# Patient Record
Sex: Male | Born: 1990 | Race: Black or African American | Hispanic: No | Smoking: Current every day smoker
Health system: Southern US, Community
[De-identification: ages and names within clinical notes are randomized; demographics above are authoritative.]

## PROBLEM LIST (undated history)

## (undated) HISTORY — PX: FINGER SURGERY: SHX640

---
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
## 2012-06-02 ENCOUNTER — Emergency Department (HOSPITAL_COMMUNITY): Payer: BC Managed Care – PPO

## 2012-06-02 ENCOUNTER — Emergency Department (HOSPITAL_COMMUNITY)
Admission: EM | Admit: 2012-06-02 | Discharge: 2012-06-02 | Disposition: A | Discharge status: Home / Self Care | Payer: BC Managed Care – PPO | Attending: Emergency Medicine | Admitting: Emergency Medicine

## 2012-06-02 ENCOUNTER — Encounter (HOSPITAL_COMMUNITY): Payer: Self-pay | Admitting: Emergency Medicine

## 2012-06-02 DIAGNOSIS — M25519 Pain in unspecified shoulder: Secondary | ICD-10-CM | POA: Insufficient documentation

## 2012-06-02 DIAGNOSIS — F17200 Nicotine dependence, unspecified, uncomplicated: Secondary | ICD-10-CM | POA: Insufficient documentation

## 2012-06-02 DIAGNOSIS — M25511 Pain in right shoulder: Secondary | ICD-10-CM

## 2012-06-02 MED ORDER — OXYCODONE-ACETAMINOPHEN 5-325 MG PO TABS: 1.0000 | ORAL_TABLET | ORAL | Status: DC | PRN

## 2012-06-02 NOTE — ED Provider Notes (Signed)
History     CSN: [id number]  Arrival date & time 06/02/12  1238   First MD Initiated Contact with Patient 06/02/12 1257      Chief Complaint  Patient presents with  . Shoulder Pain    (Consider location/radiation/quality/duration/timing/severity/associated sxs/prior treatment) HPI Pt to the ER with complaints of shoulder pain. He was playing basketball 1 week ago and it dislocated and popped back in. He says that his arm frequently dislocates and pops back in but it will only be sore and he can continue to use it as normal. He said that this time it is hurting significantly and he has decreased  ROM. He has tried taking Vicodin at home but it has not helped. Denies being unable to move elbow or fingers.  nad vss  History reviewed. No pertinent past medical history.  History reviewed. No pertinent past surgical history.  History reviewed. No pertinent family history.  History  Substance Use Topics  . Smoking status: Current Every Day Smoker  . Smokeless tobacco: Not on file  . Alcohol Use: No      Review of Systems  All other systems reviewed and are negative.    Allergies  Review of patient's allergies indicates no known allergies.  Home Medications   Current Outpatient Rx  Name  Route  Sig  Dispense  Refill  . oxyCODONE-acetaminophen (PERCOCET/ROXICET) 5-325 MG per tablet   Oral   Take 1 tablet by mouth every 4 (four) hours as needed for pain.   8 tablet   0     BP 121/50  Pulse 92  Temp(Src) 98.1 F (36.7 C) (Oral)  Resp 18  SpO2 98%  Physical Exam  Nursing note and vitals reviewed. Constitutional: He appears well-developed and well-nourished. No distress.  HENT:  Head: Normocephalic and atraumatic.  Eyes: Pupils are equal, round, and reactive to light.  Neck: Normal range of motion. Neck supple.  Cardiovascular: Normal rate and regular rhythm.   Pulmonary/Chest: Effort normal.  Abdominal: Soft.  Musculoskeletal:       Right shoulder: He  exhibits decreased range of motion, tenderness, swelling, pain and spasm. He exhibits no bony tenderness, no effusion, no crepitus, no deformity, no laceration, normal pulse and normal strength.  Neurological: He is alert.  Skin: Skin is warm and dry.    ED Course  Procedures (including critical care time)  Labs Reviewed - No data to display Dg Shoulder Right  06/02/2012   *RADIOLOGY REPORT*  Clinical Data: Shoulder dislocation.  RIGHT SHOULDER - 2+ VIEW  Comparison: None  Findings: The joint spaces are maintained.  No acute bony findings. The right lung is clear.  IMPRESSION: No fracture or dislocation.   Original Report Authenticated By: Rudie Meyer, M.D.     1. Shoulder pain, right       MDM  Normal xray. Concern for ligament damage. Arm sling and referral to Ortho given. Rx pain medication.  Pt has been advised of the symptoms that warrant their return to the ED. Patient has voiced understanding and has agreed to follow-up with the PCP or specialist.         Dorthula Matas, PA-C 06/02/12 1340

## 2012-06-02 NOTE — ED Notes (Signed)
Pt c/o right shoulder pain x 1 week since having dislocation; pt sts hx of frequent dislocation; pt denies dislocation at present; pt sts sore at present; CMS intact

## 2012-06-02 NOTE — ED Provider Notes (Signed)
Medical screening examination/treatment/procedure(s) were performed by non-physician practitioner and as supervising physician I was immediately available for consultation/collaboration.  Dione Booze, MD 06/02/12 1525

## 2012-10-29 ENCOUNTER — Encounter (HOSPITAL_COMMUNITY): Payer: Self-pay | Admitting: Emergency Medicine

## 2012-10-29 ENCOUNTER — Emergency Department (HOSPITAL_COMMUNITY)
Admission: EM | Admit: 2012-10-29 | Discharge: 2012-10-29 | Disposition: A | Discharge status: Home / Self Care | Payer: BC Managed Care – PPO | Attending: Emergency Medicine | Admitting: Emergency Medicine

## 2012-10-29 ENCOUNTER — Emergency Department (HOSPITAL_COMMUNITY): Payer: BC Managed Care – PPO

## 2012-10-29 DIAGNOSIS — T25221A Burn of second degree of right foot, initial encounter: Secondary | ICD-10-CM

## 2012-10-29 DIAGNOSIS — S81009A Unspecified open wound, unspecified knee, initial encounter: Secondary | ICD-10-CM | POA: Insufficient documentation

## 2012-10-29 DIAGNOSIS — Y9389 Activity, other specified: Secondary | ICD-10-CM | POA: Insufficient documentation

## 2012-10-29 DIAGNOSIS — F17200 Nicotine dependence, unspecified, uncomplicated: Secondary | ICD-10-CM | POA: Insufficient documentation

## 2012-10-29 DIAGNOSIS — T25229A Burn of second degree of unspecified foot, initial encounter: Secondary | ICD-10-CM | POA: Insufficient documentation

## 2012-10-29 DIAGNOSIS — S81811A Laceration without foreign body, right lower leg, initial encounter: Secondary | ICD-10-CM

## 2012-10-29 DIAGNOSIS — Y92410 Unspecified street and highway as the place of occurrence of the external cause: Secondary | ICD-10-CM | POA: Insufficient documentation

## 2012-10-29 MED ORDER — SILVER SULFADIAZINE 1 % EX CREA
TOPICAL_CREAM | Freq: Once | CUTANEOUS | Status: AC
  Administered 2012-10-29: 08:00:00 via TOPICAL
  Filled 2012-10-29: qty 85

## 2012-10-29 MED ORDER — OXYCODONE-ACETAMINOPHEN 5-325 MG PO TABS
2.0000 | ORAL_TABLET | Freq: Once | ORAL | Status: AC
  Administered 2012-10-29: 2 via ORAL
  Filled 2012-10-29: qty 2

## 2012-10-29 MED ORDER — IBUPROFEN 800 MG PO TABS: 800.0000 mg | ORAL_TABLET | Freq: Three times a day (TID) | ORAL | Status: DC

## 2012-10-29 MED ORDER — CYCLOBENZAPRINE HCL 10 MG PO TABS: 10.0000 mg | ORAL_TABLET | Freq: Three times a day (TID) | ORAL | Status: DC | PRN

## 2012-10-29 NOTE — ED Provider Notes (Signed)
LACERATION REPAIR Performed by: Antony Madura Authorized by: Antony Madura Consent: Verbal consent obtained. Risks and benefits: risks, benefits and alternatives were discussed Consent given by: patient Patient identity confirmed: provided demographic data Prepped and Draped in normal sterile fashion Wound explored  Laceration Location: R calf  Laceration Length: 3cm  No Foreign Bodies seen or palpated  Anesthesia: none  Local anesthetic: none  Anesthetic total: n/a  Irrigation method: syringe Amount of cleaning: standard  Skin closure: staples  Number of sutures: 4  Technique: simple  Patient tolerance: Patient tolerated the procedure well with no immediate complications.   Antony Madura, PA-C 10/29/12 [phone]

## 2012-10-29 NOTE — ED Notes (Signed)
Pt is back from X-Ray.  Awaiting MD

## 2012-10-29 NOTE — ED Notes (Signed)
Pt sleeping at this time.  No new needs

## 2012-10-29 NOTE — ED Notes (Signed)
Pt relaxing in the bed no change in status at this time
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
## 2012-10-29 NOTE — ED Provider Notes (Signed)
Medical screening examination/treatment/procedure(s) were conducted as a shared visit with non-physician practitioner(s) and myself.  I personally evaluated the patient during the encounter.  EKG Interpretation   None         Charles B. Bernette Mayers, MD 10/29/12 [phone]
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
## 2014-09-05 ENCOUNTER — Emergency Department (HOSPITAL_COMMUNITY)
Admission: EM | Admit: 2014-09-05 | Discharge: 2014-09-05 | Disposition: A | Discharge status: Home / Self Care | Payer: Self-pay | Attending: Emergency Medicine | Admitting: Emergency Medicine

## 2014-09-05 ENCOUNTER — Encounter (HOSPITAL_COMMUNITY): Payer: Self-pay | Admitting: Nurse Practitioner

## 2014-09-05 ENCOUNTER — Emergency Department (HOSPITAL_COMMUNITY): Payer: Self-pay

## 2014-09-05 DIAGNOSIS — Z791 Long term (current) use of non-steroidal anti-inflammatories (NSAID): Secondary | ICD-10-CM | POA: Insufficient documentation

## 2014-09-05 DIAGNOSIS — M779 Enthesopathy, unspecified: Secondary | ICD-10-CM | POA: Insufficient documentation

## 2014-09-05 DIAGNOSIS — Z720 Tobacco use: Secondary | ICD-10-CM | POA: Insufficient documentation

## 2014-09-05 MED ORDER — IBUPROFEN 800 MG PO TABS: 800.0000 mg | ORAL_TABLET | Freq: Three times a day (TID) | ORAL | Status: AC

## 2014-09-05 NOTE — ED Notes (Signed)
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 Pain worse with walking. Tried vicodin with no relief. Ambulatory.

## 2014-09-05 NOTE — Discharge Instructions (Signed)

## 2014-09-05 NOTE — ED Provider Notes (Signed)
CSN: [id number]     Arrival date & time 09/05/14  1812 History  This chart was scribed for non-physician provider Roxy Horseman, PA-C, working with Laurence Spates, MD by Phillis Haggis, ED Scribe. This patient was seen in room [facility] and patient care was started at 7:54 PM.   Chief Complaint  Patient presents with  . Foot Pain   The history is provided by the patient. No language interpreter was used.  HPI Comments: Dustin Dickerson is a 24 y.o. male who presents to the Emergency Department complaining of right foot pain onset three weeks ago. Pt reports worsening pain with walking and states that the pain starts on the medial surface of the foot and radiates up to his ankle. Reports trying Vicodin for pain with no relief. Pt states that he recently started a new job at a [employer] where he walks on a lot of dirt, and believes it may be contributing to the pain. He denies injury to the area, numbness, weakness, or joint swelling.   History reviewed. No pertinent past medical history. Past Surgical History  Procedure Laterality Date  . Finger surgery     History reviewed. No pertinent family history. Social History  Substance Use Topics  . Smoking status: Current Every Day Smoker  . Smokeless tobacco: None  . Alcohol Use: No    Review of Systems  Musculoskeletal: Positive for arthralgias.      Allergies  Review of patient's allergies indicates no known allergies.  Home Medications   Prior to Admission medications   Medication Sig Start Date End Date Taking? Authorizing Provider  cyclobenzaprine (FLEXERIL) 10 MG tablet Take 1 tablet (10 mg total) by mouth 3 (three) times daily as needed for muscle spasms. 10/29/12   Susy Frizzle, MD  ibuprofen (ADVIL,MOTRIN) 800 MG tablet Take 1 tablet (800 mg total) by mouth 3 (three) times daily. 10/29/12   Susy Frizzle, MD   BP 140/84 mmHg  Pulse 74  Temp(Src) 98.4 F (36.9 C) (Oral)  Resp 15  SpO2  100%  Physical Exam  Constitutional: He is oriented to person, place, and time. He appears well-developed and well-nourished. No distress.  HENT:  Head: Normocephalic and atraumatic.  Eyes: Conjunctivae and EOM are normal.  Neck: Normal range of motion. Neck supple.  Cardiovascular: Normal rate, regular rhythm, normal heart sounds and intact distal pulses.   Intact distal pulses  Pulmonary/Chest: Effort normal and breath sounds normal.  Abdominal: He exhibits no distension.  Musculoskeletal: Normal range of motion. He exhibits no edema.  Right foot mildly tender to palpation over the dorsal aspect, no bony abnormality or deformity, range of motion and strength is 5/5  Neurological: He is alert and oriented to person, place, and time.  Sensation and strength intact  Skin: Skin is warm and dry.  No evidence of cellulitis or abscess or septic joint  Psychiatric: He has a normal mood and affect. His behavior is normal. Judgment and thought content normal.  Nursing note and vitals reviewed.   ED Course  Procedures (including critical care time) DIAGNOSTIC STUDIES: Oxygen Saturation is 100% on RA, normal by my interpretation.    COORDINATION OF CARE: 7:56 PM-Discussed treatment plan which includes Pitera techniques, ibuprofen, and follow up with orthopedist if necessary with pt at bedside and pt agreed to plan.   Labs Review Labs Reviewed - No data to display  Imaging Review Dg Foot Complete Right  09/05/2014   CLINICAL DATA:  Dorsal foot pain extending  into the ankle for 2 weeks. No known injury. Initial encounter.  EXAM: RIGHT FOOT COMPLETE - 3+ VIEW  COMPARISON:  Foot radiographs 10/29/2012.  FINDINGS: The mineralization and alignment are normal. There is no evidence of acute fracture or dislocation. Diminutive distal second phalanx appears unchanged, likely related to remote injury. The joint spaces are maintained. No foreign bodies demonstrated.  IMPRESSION: Stable right foot  radiographs.  No acute osseous findings.   Electronically Signed   By: Carey Bullocks M.D.   On: 09/05/2014 19:30      EKG Interpretation None      MDM   Final diagnoses:  Tendonitis    Patient with tendinitis of the right foot along the dorsal aspect, pain is worsened with dorsiflexion, no bony abnormality or deformity, plain films are negative, discharged to home with instructions for Igo therapy and orthopedic follow-up as needed. Patient understands and agrees to plan. He is stable and ready for discharge.  I personally performed the services described in this documentation, which was scribed in my presence. The recorded information has been reviewed and is accurate.     Roxy Horseman, PA-C 09/05/14 2001  Laurence Spates, MD 09/05/14 [phone]
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
## 2015-09-20 ENCOUNTER — Emergency Department (HOSPITAL_COMMUNITY): Admission: EM | Admit: 2015-09-20 | Discharge: 2015-09-20 | Discharge status: Against Medical Advice | Payer: Self-pay

## 2015-09-20 NOTE — ED Notes (Signed)
Unable to find in waiting room. 

## 2015-09-20 NOTE — ED Notes (Signed)
Called x1 in lobby, no answer
